# Patient Record
Sex: Male | Born: 1990 | Race: White | Hispanic: No | Marital: Single | Smoking: Current every day smoker
Health system: Southern US, Community
[De-identification: ages and names within clinical notes are randomized; demographics above are authoritative.]

---
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
## 2014-10-16 ENCOUNTER — Encounter (HOSPITAL_COMMUNITY): Payer: Self-pay | Admitting: Emergency Medicine

## 2014-10-16 ENCOUNTER — Emergency Department (HOSPITAL_COMMUNITY)
Admission: EM | Admit: 2014-10-16 | Discharge: 2014-10-16 | Disposition: A | Discharge status: Home / Self Care | Payer: Self-pay | Attending: Emergency Medicine | Admitting: Emergency Medicine

## 2014-10-16 DIAGNOSIS — L03115 Cellulitis of right lower limb: Secondary | ICD-10-CM | POA: Insufficient documentation

## 2014-10-16 DIAGNOSIS — Z720 Tobacco use: Secondary | ICD-10-CM | POA: Insufficient documentation

## 2014-10-16 MED ORDER — SULFAMETHOXAZOLE-TRIMETHOPRIM 800-160 MG PO TABS: 1.0000 | ORAL_TABLET | Freq: Two times a day (BID) | ORAL | Status: AC

## 2014-10-16 MED ORDER — SULFAMETHOXAZOLE-TRIMETHOPRIM 800-160 MG PO TABS
1.0000 | ORAL_TABLET | Freq: Once | ORAL | Status: AC
  Administered 2014-10-16: 1 via ORAL
  Filled 2014-10-16: qty 1

## 2014-10-16 MED ORDER — CEPHALEXIN 500 MG PO CAPS: 500.0000 mg | ORAL_CAPSULE | Freq: Two times a day (BID) | ORAL | Status: AC

## 2014-10-16 MED ORDER — CEPHALEXIN 250 MG PO CAPS
500.0000 mg | ORAL_CAPSULE | Freq: Once | ORAL | Status: AC
  Administered 2014-10-16: 500 mg via ORAL
  Filled 2014-10-16: qty 2

## 2014-10-16 NOTE — ED Notes (Signed)
Pt states that he woke up yesterday morning with a pimple on his right lateral thigh. Pt states that the area got more red and large throughout the day.

## 2014-10-16 NOTE — ED Provider Notes (Signed)
CSN: [id number]     Arrival date & time 10/16/14  0017 History  This chart was scribed for Loren Racer, MD by Freida Busman, ED Scribe. This patient was seen in room D33C/D33C and the patient's care was started 2:53 AM.    Chief Complaint  Patient presents with  . Abscess    The history is provided by the patient. No language interpreter was used.     HPI Comments:  Brandley Aldrete is a 24 y.o. male who presents to the Emergency Department complaining of small pimple to his inner right thigh which he noticed upon waking yesterday am. Pt states the area has rapidly increased in size and there is moderate pain to the site. He has applied warm compresses with moderate improvement. He denies drainage. He reports a history of ingrown hairs in the past but states they have never gotten to this size or pain level. No associated symptoms noted.    History reviewed. No pertinent past medical history. History reviewed. No pertinent past surgical history. No family history on file. History  Substance Use Topics  . Smoking status: Current Every Day Smoker  . Smokeless tobacco: Not on file  . Alcohol Use: Yes    Review of Systems  Constitutional: Negative for fever and chills.  Gastrointestinal: Negative for nausea and vomiting.  Skin: Positive for rash.  All other systems reviewed and are negative.     Allergies  Review of patient's allergies indicates no known allergies.  Home Medications   Prior to Admission medications   Medication Sig Start Date End Date Taking? Authorizing Provider  cephALEXin (KEFLEX) 500 MG capsule Take 1 capsule (500 mg total) by mouth 2 (two) times daily. 10/16/14   Loren Racer, MD  sulfamethoxazole-trimethoprim (BACTRIM DS,SEPTRA DS) 800-160 MG per tablet Take 1 tablet by mouth 2 (two) times daily. 10/16/14 10/23/14  Loren Racer, MD   BP 140/79 mmHg  Pulse 67  Temp(Src) 98.3 F (36.8 C) (Oral)  Resp 14  SpO2 97% Physical Exam  Constitutional: He  is oriented to person, place, and time. He appears well-developed and well-nourished. No distress.  HENT:  Head: Normocephalic and atraumatic.  Mouth/Throat: Oropharynx is clear and moist.  Eyes: EOM are normal. Pupils are equal, round, and reactive to light.  Neck: Normal range of motion. Neck supple.  Cardiovascular: Normal rate and regular rhythm.   Pulmonary/Chest: Effort normal and breath sounds normal. No respiratory distress. He has no wheezes. He has no rales.  Abdominal: Soft. Bowel sounds are normal.  Musculoskeletal: Normal range of motion. He exhibits no edema or tenderness.  Neurological: He is alert and oriented to person, place, and time.  Skin: Skin is warm and dry. No rash noted. There is erythema.  Patient with area of 4-5 cm centimeters in diameter of erythema on the right medial thigh. There is induration but no evidence of mass.  Psychiatric: He has a normal mood and affect. His behavior is normal.  Nursing note and vitals reviewed.   ED Course  Procedures   DIAGNOSTIC STUDIES:  Oxygen Saturation is 99% on RA, normal by my interpretation.    COORDINATION OF CARE:  2:56 AM Advised pt on return precautions. Will discharge with antibiotics. Discussed treatment plan with pt at bedside and pt agreed to plan.  Labs Review Labs Reviewed - No data to display  Imaging Review No results found.   EKG Interpretation None      MDM   Final diagnoses:  Cellulitis of right lower  extremity    I personally performed the services described in this documentation, which was scribed in my presence. The recorded information has been reviewed and is accurate.    Loren Raceravid Earley Grobe, MD 10/23/14 [phone]

## 2014-10-16 NOTE — ED Notes (Signed)
Pt. presents with right inner upper thigh abscess with no drainage onset today .

## 2014-10-16 NOTE — Discharge Instructions (Signed)

## 2016-05-13 ENCOUNTER — Emergency Department (HOSPITAL_COMMUNITY): Payer: Self-pay

## 2016-05-13 ENCOUNTER — Encounter (HOSPITAL_COMMUNITY): Payer: Self-pay | Admitting: *Deleted

## 2016-05-13 ENCOUNTER — Emergency Department (HOSPITAL_COMMUNITY)
Admission: EM | Admit: 2016-05-13 | Discharge: 2016-05-13 | Disposition: A | Discharge status: Home / Self Care | Payer: Self-pay | Attending: Emergency Medicine | Admitting: Emergency Medicine

## 2016-05-13 DIAGNOSIS — S0990XA Unspecified injury of head, initial encounter: Secondary | ICD-10-CM

## 2016-05-13 DIAGNOSIS — S060X1A Concussion with loss of consciousness of 30 minutes or less, initial encounter: Secondary | ICD-10-CM | POA: Insufficient documentation

## 2016-05-13 DIAGNOSIS — Y929 Unspecified place or not applicable: Secondary | ICD-10-CM | POA: Insufficient documentation

## 2016-05-13 DIAGNOSIS — Y939 Activity, unspecified: Secondary | ICD-10-CM | POA: Insufficient documentation

## 2016-05-13 DIAGNOSIS — Y999 Unspecified external cause status: Secondary | ICD-10-CM | POA: Insufficient documentation

## 2016-05-13 DIAGNOSIS — F17200 Nicotine dependence, unspecified, uncomplicated: Secondary | ICD-10-CM | POA: Insufficient documentation

## 2016-05-13 MED ORDER — ACETAMINOPHEN 500 MG PO TABS
1000.0000 mg | ORAL_TABLET | Freq: Once | ORAL | Status: AC
  Administered 2016-05-13: 1000 mg via ORAL
  Filled 2016-05-13: qty 2

## 2016-05-13 MED ORDER — IBUPROFEN 800 MG PO TABS
800.0000 mg | ORAL_TABLET | Freq: Once | ORAL | Status: AC
  Administered 2016-05-13: 800 mg via ORAL
  Filled 2016-05-13: qty 1

## 2016-05-13 NOTE — Discharge Instructions (Signed)
Take 4 over the counter ibuprofen tablets 3 times a day or 2 over-the-counter naproxen tablets twice a day for pain. Also take tylenol 1000mg(2 extra strength) four times a day.    
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
## 2016-05-13 NOTE — ED Notes (Signed)
Sleeping unless disturbed 

## 2016-05-13 NOTE — ED Notes (Signed)
Pt stable, ambulatory, states understanding of discharge instructions 
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
## 2019-04-15 DEATH — deceased
